# Patient Record
Sex: Male | Born: 1995 | Race: Black or African American | Hispanic: No | Marital: Single | Smoking: Current every day smoker
Health system: Southern US, Community
[De-identification: ages and names within clinical notes are randomized; demographics above are authoritative.]

---
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
## 2015-03-28 ENCOUNTER — Emergency Department (HOSPITAL_COMMUNITY)
Admission: EM | Admit: 2015-03-28 | Discharge: 2015-03-28 | Disposition: A | Discharge status: Home / Self Care | Payer: Self-pay | Attending: Emergency Medicine | Admitting: Emergency Medicine

## 2015-03-28 ENCOUNTER — Encounter (HOSPITAL_COMMUNITY): Payer: Self-pay | Admitting: Emergency Medicine

## 2015-03-28 DIAGNOSIS — Z8619 Personal history of other infectious and parasitic diseases: Secondary | ICD-10-CM | POA: Insufficient documentation

## 2015-03-28 DIAGNOSIS — F17200 Nicotine dependence, unspecified, uncomplicated: Secondary | ICD-10-CM | POA: Insufficient documentation

## 2015-03-28 DIAGNOSIS — Z202 Contact with and (suspected) exposure to infections with a predominantly sexual mode of transmission: Secondary | ICD-10-CM | POA: Insufficient documentation

## 2015-03-28 MED ORDER — AZITHROMYCIN 250 MG PO TABS
1000.0000 mg | ORAL_TABLET | Freq: Once | ORAL | Status: AC
  Administered 2015-03-28: 1000 mg via ORAL
  Filled 2015-03-28: qty 4

## 2015-03-28 MED ORDER — CEFTRIAXONE SODIUM 250 MG IJ SOLR
250.0000 mg | Freq: Once | INTRAMUSCULAR | Status: AC
  Administered 2015-03-28: 250 mg via INTRAMUSCULAR
  Filled 2015-03-28: qty 250

## 2015-03-28 MED ORDER — LIDOCAINE HCL (PF) 1 % IJ SOLN
2.0000 mL | Freq: Once | INTRAMUSCULAR | Status: AC
  Administered 2015-03-28: 0.9 mL
  Filled 2015-03-28: qty 5

## 2015-03-28 NOTE — ED Notes (Signed)
Bed: WA27 Expected date:  Expected time:  Means of arrival:  Comments: 
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
## 2015-03-28 NOTE — Discharge Instructions (Signed)
1. Medications: usual home medications 2. Treatment: rest, drink plenty of fluids 3. Follow Up: please followup with your primary doctor for discussion of your diagnoses and further evaluation after today's visit; if you do not have a primary care doctor use the resource guide provided to find one; please return to the ER for abdominal pain, vomiting, penile pain or discharge, testicular pain or swelling    Sexually Transmitted Disease A sexually transmitted disease (STD) is a disease or infection that may be passed (transmitted) from person to person, usually during sexual activity. This may happen by way of saliva, semen, blood, vaginal mucus, or urine. Common STDs include:  Gonorrhea.  Chlamydia.  Syphilis.  HIV and AIDS.  Genital herpes.  Hepatitis B and C.  Trichomonas.  Human papillomavirus (HPV).  Pubic lice.  Scabies.  Mites.  Bacterial vaginosis. WHAT ARE CAUSES OF STDs? An STD may be caused by bacteria, a virus, or parasites. STDs are often transmitted during sexual activity if one person is infected. However, they may also be transmitted through nonsexual means. STDs may be transmitted after:   Sexual intercourse with an infected person.  Sharing sex toys with an infected person.  Sharing needles with an infected person or using unclean piercing or tattoo needles.  Having intimate contact with the genitals, mouth, or rectal areas of an infected person.  Exposure to infected fluids during birth. WHAT ARE THE SIGNS AND SYMPTOMS OF STDs? Different STDs have different symptoms. Some people may not have any symptoms. If symptoms are present, they may include:  Painful or bloody urination.  Pain in the pelvis, abdomen, vagina, anus, throat, or eyes.  A skin rash, itching, or irritation.  Growths, ulcerations, blisters, or sores in the genital and anal areas.  Abnormal vaginal discharge with or without bad odor.  Penile discharge in men.  Fever.  Pain  or bleeding during sexual intercourse.  Swollen glands in the groin area.  Yellow skin and eyes (jaundice). This is seen with hepatitis.  Swollen testicles.  Infertility.  Sores and blisters in the mouth. HOW ARE STDs DIAGNOSED? To make a diagnosis, your health care provider may:  Take a medical history.  Perform a physical exam.  Take a sample of any discharge to examine.  Swab the throat, cervix, opening to the penis, rectum, or vagina for testing.  Test a sample of your first morning urine.  Perform blood tests.  Perform a Pap test, if this applies.  Perform a colposcopy.  Perform a laparoscopy. HOW ARE STDs TREATED? Treatment depends on the STD. Some STDs may be treated but not cured.  Chlamydia, gonorrhea, trichomonas, and syphilis can be cured with antibiotic medicine.  Genital herpes, hepatitis, and HIV can be treated, but not cured, with prescribed medicines. The medicines lessen symptoms.  Genital warts from HPV can be treated with medicine or by freezing, burning (electrocautery), or surgery. Warts may come back.  HPV cannot be cured with medicine or surgery. However, abnormal areas may be removed from the cervix, vagina, or vulva.  If your diagnosis is confirmed, your recent sexual partners need treatment. This is true even if they are symptom-free or have a negative culture or evaluation. They should not have sex until their health care providers say it is okay.  Your health care provider may test you for infection again 3 months after treatment. HOW CAN I REDUCE MY RISK OF GETTING AN STD? Take these steps to reduce your risk of getting an STD:  Use latex  condoms, dental dams, and water-soluble lubricants during sexual activity. Do not use petroleum jelly or oils.  Avoid having multiple sex partners.  Do not have sex with someone who has other sex partners  Do not have sex with anyone you do not know or who is at high risk for an STD.  Avoid risky  sex practices that can break your skin.  Do not have sex if you have open sores on your mouth or skin.  Avoid drinking too much alcohol or taking illegal drugs. Alcohol and drugs can affect your judgment and put you in a vulnerable position.  Avoid engaging in oral and anal sex acts.  Get vaccinated for HPV and hepatitis. If you have not received these vaccines in the past, talk to your health care provider about whether one or both might be right for you.  If you are at risk of being infected with HIV, it is recommended that you take a prescription medicine daily to prevent HIV infection. This is called pre-exposure prophylaxis (PrEP). You are considered at risk if:  You are a man who has sex with other men (MSM).  You are a heterosexual man or woman and are sexually active with more than one partner.  You take drugs by injection.  You are sexually active with a partner who has HIV.  Talk with your health care provider about whether you are at high risk of being infected with HIV. If you choose to begin PrEP, you should first be tested for HIV. You should then be tested every 3 months for as long as you are taking PrEP. WHAT SHOULD I DO IF I THINK I HAVE AN STD?  See your health care provider.  Tell your sexual partner(s). They should be tested and treated for any STDs.  Do not have sex until your health care provider says it is okay. WHEN SHOULD I GET IMMEDIATE MEDICAL CARE? Contact your health care provider right away if:   You have severe abdominal pain.  You are a man and notice swelling or pain in your testicles.  You are a woman and notice swelling or pain in your vagina.   This information is not intended to replace advice given to you by your health care provider. Make sure you discuss any questions you have with your health care provider.   Document Released: 07/02/2002 Document Revised: 05/02/2014 Document Reviewed: 10/30/2012 Elsevier Interactive Patient Education  2016 [employer].    Emergency Department Resource Guide 1) Find a Doctor and Pay Out of Pocket Although you won't have to find out who is covered by your insurance plan, it is a good idea to ask around and get recommendations. You will then need to call the office and see if the doctor you have chosen will accept you as a new patient and what types of options they offer for patients who are self-pay. Some doctors offer discounts or will set up payment plans for their patients who do not have insurance, but you will need to ask so you aren't surprised when you get to your appointment.  2) Contact Your Local Health Department Not all health departments have doctors that can see patients for sick visits, but many do, so it is worth a call to see if yours does. If you don't know where your local health department is, you can check in your phone book. The CDC also has a tool to help you locate your state's health department, and many state websites also  have listings of all of their local health departments.  3) Find a Walk-in Clinic If your illness is not likely to be very severe or complicated, you may want to try a walk in clinic. These are popping up all over the country in pharmacies, drugstores, and shopping centers. They're usually staffed by nurse practitioners or physician assistants that have been trained to treat common illnesses and complaints. They're usually fairly quick and inexpensive. However, if you have serious medical issues or chronic medical problems, these are probably not your best option.  No Primary Care Doctor: - Call Health Connect at  [phone] - they can help you locate a primary care doctor that  accepts your insurance, provides certain services, etc. - Physician Referral Service- [phone]  Chronic Pain Problems: Organization         Address  Phone   Notes  [facility]  [phone] Patients need to be referred by their primary care doctor.    Medication Assistance: Organization         Address  Phone   Notes  [facility] Medication [facility] [address] [place] [place], [place] [address] [phone] --Must be a resident of [facility] -- Must have NO insurance coverage whatsoever (no Medicaid/ Medicare, etc.) -- The pt. MUST have a primary care doctor that directs their care regularly and follows them in the community   [facility]ist  [phone]   [employer]  [phone]    Agencies that provide inexpensive medical care: Organization         Address  Phone   Notes  [facility]  [phone]   [facility]    [phone]   [facility] [address] [place], [place] [address] [phone]   [facility] [address] [place] [place] [phone]   Planned Parenthood    [phone]   [facility]    [phone]   [facility] and [facility]  [address], [place] Phone:  [phone], Fax:  [phone] Hours of Operation:  9 am - 6 pm, M-F.  Also accepts Medicaid/Medicare and self-pay.  [facility] for Children  [address], [place] Phone: [phone], Fax: [phone]. Hours of Operation:  8:30 am - 5:30 pm, M-F.  Also accepts Medicaid and self-pay.  [facility] [place] [address] [place] Phone: [phone]   [facility] [address] Natasha Bence [place], [place] [phone], Ext. [phone] Mondays & Thursdays: 7-9 AM.  First 15 patients are seen on a first come, first serve basis.    Medicaid-accepting [facility] Providers:  Organization         Address  Phone   Notes  [facility] [address],  [phone] Also accepts self-pay patients.  [facility] [address] Laurell Josephs [place], [place]  [phone]   [facility] [address], [place] [phone]   [facility] [address] [place] [phone]   Renaye Rakers [address], [place]   [phone] Only accepts [place] Access [place] patients after they have their name applied to their card.   Self-Pay (no insurance) in [facility]:  [employer]  Notes  Sickle Cell Patients, [facility] [address] [place], [place] [phone]   [facility] [address] [place], [place] [phone]   [facility] [place]  [address], [place] [phone]   [facility]/Dr. Osei-Bonsu  [address], [place] or [address], [place] [phone] Phone number for both [place] and [place] locations is the same.  [facility] [address], [place] [phone]   [facility] [address] [place] or [address] [phone]   [facility] [address] [place] [phone], phone; [phone], fax Sees patients 1st and 3rd Saturday of every month.  Must not qualify for public or private insurance (i.e. Medicaid, Medicare, [place] Health Choice, Veterans' Benefits)  Household income should be no more than 200% of the poverty level The clinic cannot treat you if you are pregnant or think you are pregnant  Sexually transmitted diseases are not treated at the clinic.    Dental Care: Organization         Address  Phone  Notes  [facility] Department of [facility] [address] [place], [place] [phone] Accepts children up to age 29 who are enrolled in [place] or [place] Health Choice; pregnant women with a Medicaid card; and children who have applied for Medicaid or [place] Health Choice, but were declined, whose parents can pay a reduced fee at time of service.    [facility] Department of [facility]  [address], [place] [phone] Accepts children up to age 59 who are enrolled in [place] or [place] Health Choice; pregnant women with a Medicaid card; and children who have applied for Medicaid or  Health Choice, but were declined, whose parents can pay a reduced fee at time of service.  [facility]  [address] [place], [place] [phone] Patients are seen by appointment only. Walk-ins are not accepted. [facility] will see patients 63 years of age and older. Monday - Tuesday (8am-5pm) Most Wednesdays (8:30-5pm) $30 per visit, cash only  [facility] Adult Dental Access PROGRAM  [address], [facility] [phone] Patients are seen by appointment only. Walk-ins are not accepted. [facility] will see patients 13 years of age and older. One Wednesday Evening (Monthly: Volunteer Based).  $30 per visit, cash only  [employer]  [phone] for adults; Children under age 76, call Graduate Pediatric Dentistry at [phone]. Children aged 44-14, please call [phone] to request a pediatric application.  Dental services are provided in all areas of dental care including fillings, crowns and bridges, complete and partial dentures, implants, gum treatment, root canals, and extractions. Preventive care is also provided. Treatment is provided to both adults and children. Patients are selected via a lottery and there is often a waiting list.   [facility] [address] [place]  [phone] [email or web address]   [facility] [address] [place], [place] [phone], Ext. [phone] Second and Fourth Thursday of each month, opens at 6:30 AM; Clinic ends at 9 AM.  Patients are seen on a first-come first-served basis, and a limited number are seen during each clinic.   [facility]  [address] Ether Griffins [place], [place] [phone]  Eligibility Requirements You must have lived in [place], [place], or [place] counties for at least the last three months.   You cannot be eligible for state or federal sponsored [place], including [employer], [place], or [employer].   You generally cannot be eligible for healthcare insurance through your employer.    How to apply: Eligibility screenings are held every Tuesday and Wednesday afternoon from 1:00 pm until 4:00 pm. You do not need an appointment for the interview!  [facility] [address] [place], [place] [phone]   [facility] Health Department  [phone]   [facility] Department  [phone]   [facility] Health Department  [phone]    Behavioral Health Resources in the Community: Intensive Outpatient Programs Organization         Address  Phone  Notes  [facility] [address], [place], [place] [phone]   [facility] Outpatient [address], [place], [place] [phone]   [facility] [address] [place], [place]  [phone]   [facility] Mental Health [address]  [place], [place] [phone] or [phone]   Substance Abuse Resources Organization         Address  Phone  Notes  [facility]  [phone]   [facility]  [phone]   [facility] [place]  [phone]   Floydene Flock  [phone]   Residential & Outpatient Substance Abuse Program  [phone]   Psychological Services Organization         Address  Phone  Notes  [facility] Behavioral Health  [phone]   [facility] Services  [phone]   [facility] Mental Health [address] [place] [phone] or [phone]    Mobile Crisis Teams Organization         Address  Phone  Notes  [facility], Mobile Crisis Care Unit  [phone]   [facility]  [address] [place], [place] [phone]   Doristine Locks [address] [place] [phone]    Self-Help/Support Groups Organization         Address  Phone             Notes  [facility] [place] - variety of support groups  336- I7437963 Call for more information  Narcotics Anonymous (NA), [facility] [address], [employer] [place]  2 meetings at this location   Statistician         Address  Phone  Notes  [facility] [address],    [place]  [phone]   [facility]  [address] [place] [address], [place], [place] [phone]   [facility] [address] [place], [place] [phone] Admissions: 8am-3pm M-F  [facility] [address]    [place], [place] [phone]   [facility] [address] Starling Manns [place], [place] [phone]   [facility] [address]  [place], [place] [phone]   [facility] - Intensive Outpatient [address] Laurell Josephs [address], [place], [place] [phone]   [facility] ([facility] [address] [place].,  [place], [place] [phone] or [phone]   [facility] ([facility]) [address] [place], [place] [phone] Accepts Medicaid  [facility] [place] [address],  [place] [phone] Substance Abuse/Addiction Treatment   [facility] Resources Organization  Address  Phone  Notes  [facility]  [phone]   Domenic Schwab, PhD [address] Arlis [place], [place]   [phone] or [phone]   [place], [place] [phone]   [address] [place], [place] [phone] Insurance/Medicaid/sponsorship through [facility] and Families [address]                                    [place], [place] [phone]  [place] [address], [place] [phone]    Dr. Adele Schilder  [phone]   [facility] Dept. 1) [address] [place] 2) [place] 3)  [address], [place] [phone]  [phone]   [facility] [phone] or [phone] (After Hours)

## 2015-03-28 NOTE — ED Notes (Signed)
Pt had no reaction to IM ABT at this time.
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
## 2015-03-28 NOTE — ED Notes (Signed)
Awake. Verbally responsive. A/O x4. Resp even and unlabored. No audible adventitious breath sounds noted. ABC's intact.  

## 2015-03-30 LAB — GC/CHLAMYDIA PROBE AMP (~~LOC~~) NOT AT ARMC
CHLAMYDIA, DNA PROBE: NEGATIVE
NEISSERIA GONORRHEA: NEGATIVE

## 2016-04-16 DIAGNOSIS — W25XXXA Contact with sharp glass, initial encounter: Secondary | ICD-10-CM | POA: Insufficient documentation

## 2016-04-16 DIAGNOSIS — Y929 Unspecified place or not applicable: Secondary | ICD-10-CM | POA: Insufficient documentation

## 2016-04-16 DIAGNOSIS — S21211A Laceration without foreign body of right back wall of thorax without penetration into thoracic cavity, initial encounter: Secondary | ICD-10-CM | POA: Insufficient documentation

## 2016-04-16 DIAGNOSIS — Y999 Unspecified external cause status: Secondary | ICD-10-CM | POA: Insufficient documentation

## 2016-04-16 DIAGNOSIS — F17200 Nicotine dependence, unspecified, uncomplicated: Secondary | ICD-10-CM | POA: Insufficient documentation

## 2016-04-16 DIAGNOSIS — Y939 Activity, unspecified: Secondary | ICD-10-CM | POA: Insufficient documentation

## 2016-04-16 DIAGNOSIS — Z23 Encounter for immunization: Secondary | ICD-10-CM | POA: Insufficient documentation

## 2016-04-17 ENCOUNTER — Emergency Department (HOSPITAL_COMMUNITY)
Admission: EM | Admit: 2016-04-17 | Discharge: 2016-04-17 | Disposition: A | Discharge status: Home / Self Care | Payer: Self-pay | Attending: Emergency Medicine | Admitting: Emergency Medicine

## 2016-04-17 ENCOUNTER — Encounter (HOSPITAL_COMMUNITY): Payer: Self-pay | Admitting: *Deleted

## 2016-04-17 DIAGNOSIS — S21211A Laceration without foreign body of right back wall of thorax without penetration into thoracic cavity, initial encounter: Secondary | ICD-10-CM

## 2016-04-17 MED ORDER — BACITRACIN ZINC 500 UNIT/GM EX OINT
1.0000 "application " | TOPICAL_OINTMENT | Freq: Two times a day (BID) | CUTANEOUS | Status: DC
  Administered 2016-04-17: 1 via TOPICAL
  Filled 2016-04-17: qty 0.9

## 2016-04-17 MED ORDER — TETANUS-DIPHTH-ACELL PERTUSSIS 5-2.5-18.5 LF-MCG/0.5 IM SUSP
0.5000 mL | Freq: Once | INTRAMUSCULAR | Status: AC
  Administered 2016-04-17: 0.5 mL via INTRAMUSCULAR
  Filled 2016-04-17: qty 0.5

## 2016-04-17 NOTE — ED Notes (Signed)
Pt verbalized understanding discharge instructions and denies any further needs or questions at this time. VS stable, ambulatory and steady gait.   
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
## 2017-10-30 ENCOUNTER — Ambulatory Visit (HOSPITAL_COMMUNITY)
Admission: EM | Admit: 2017-10-30 | Discharge: 2017-10-30 | Disposition: A | Discharge status: Home / Self Care | Payer: Self-pay | Attending: Family Medicine | Admitting: Family Medicine

## 2017-10-30 ENCOUNTER — Encounter (HOSPITAL_COMMUNITY): Payer: Self-pay | Admitting: Emergency Medicine

## 2017-10-30 DIAGNOSIS — S39012A Strain of muscle, fascia and tendon of lower back, initial encounter: Secondary | ICD-10-CM

## 2017-10-30 MED ORDER — DICLOFENAC SODIUM 75 MG PO TBEC: 75.0000 mg | DELAYED_RELEASE_TABLET | Freq: Two times a day (BID) | ORAL | 0 refills | Status: AC

## 2017-10-30 MED ORDER — CYCLOBENZAPRINE HCL 10 MG PO TABS: 10.0000 mg | ORAL_TABLET | Freq: Three times a day (TID) | ORAL | 0 refills | Status: AC
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
## 2019-07-22 ENCOUNTER — Other Ambulatory Visit: Payer: Self-pay

## 2019-07-22 ENCOUNTER — Emergency Department (HOSPITAL_COMMUNITY)
Admission: EM | Admit: 2019-07-22 | Discharge: 2019-07-23 | Disposition: A | Discharge status: Home / Self Care | Payer: Self-pay | Attending: Emergency Medicine | Admitting: Emergency Medicine

## 2019-07-22 ENCOUNTER — Encounter (HOSPITAL_COMMUNITY): Payer: Self-pay

## 2019-07-22 DIAGNOSIS — K920 Hematemesis: Secondary | ICD-10-CM | POA: Insufficient documentation

## 2019-07-22 DIAGNOSIS — Z20822 Contact with and (suspected) exposure to covid-19: Secondary | ICD-10-CM | POA: Insufficient documentation

## 2019-07-22 DIAGNOSIS — E860 Dehydration: Secondary | ICD-10-CM | POA: Insufficient documentation

## 2019-07-22 DIAGNOSIS — F17210 Nicotine dependence, cigarettes, uncomplicated: Secondary | ICD-10-CM | POA: Insufficient documentation

## 2019-07-22 DIAGNOSIS — K226 Gastro-esophageal laceration-hemorrhage syndrome: Secondary | ICD-10-CM | POA: Insufficient documentation

## 2019-07-22 DIAGNOSIS — R112 Nausea with vomiting, unspecified: Secondary | ICD-10-CM

## 2019-07-22 LAB — CBC
HCT: 50.5 % (ref 39.0–52.0)
Hemoglobin: 18 g/dL — ABNORMAL HIGH (ref 13.0–17.0)
MCH: 33.6 pg (ref 26.0–34.0)
MCHC: 35.6 g/dL (ref 30.0–36.0)
MCV: 94.2 fL (ref 80.0–100.0)
Platelets: 227 10*3/uL (ref 150–400)
RBC: 5.36 MIL/uL (ref 4.22–5.81)
RDW: 11.9 % (ref 11.5–15.5)
WBC: 11.3 10*3/uL — ABNORMAL HIGH (ref 4.0–10.5)
nRBC: 0 % (ref 0.0–0.2)

## 2019-07-22 LAB — URINALYSIS, ROUTINE W REFLEX MICROSCOPIC
Bilirubin Urine: NEGATIVE
Glucose, UA: NEGATIVE mg/dL
Hgb urine dipstick: NEGATIVE
Ketones, ur: 20 mg/dL — AB
Leukocytes,Ua: NEGATIVE
Nitrite: NEGATIVE
Protein, ur: >=300 mg/dL — AB
Specific Gravity, Urine: 1.035 — ABNORMAL HIGH (ref 1.005–1.030)
pH: 5 (ref 5.0–8.0)

## 2019-07-22 LAB — BASIC METABOLIC PANEL
Anion gap: 18 — ABNORMAL HIGH (ref 5–15)
BUN: 19 mg/dL (ref 6–20)
CO2: 23 mmol/L (ref 22–32)
Calcium: 10.4 mg/dL — ABNORMAL HIGH (ref 8.9–10.3)
Chloride: 100 mmol/L (ref 98–111)
Creatinine, Ser: 1.3 mg/dL — ABNORMAL HIGH (ref 0.61–1.24)
GFR calc Af Amer: >60 mL/min (ref >60)
GFR calc non Af Amer: >60 mL/min (ref >60)
Glucose, Bld: 124 mg/dL — ABNORMAL HIGH (ref 70–99)
Potassium: 4.6 mmol/L (ref 3.5–5.1)
Sodium: 141 mmol/L (ref 135–145)

## 2019-07-22 MED ORDER — LACTATED RINGERS IV BOLUS
1000.0000 mL | Freq: Once | INTRAVENOUS | Status: AC
  Administered 2019-07-22: 1000 mL via INTRAVENOUS

## 2019-07-22 MED ORDER — FENTANYL CITRATE (PF) 100 MCG/2ML IJ SOLN
50.0000 ug | Freq: Once | INTRAMUSCULAR | Status: AC
  Administered 2019-07-22: 50 ug via INTRAVENOUS
  Filled 2019-07-22: qty 2

## 2019-07-22 MED ORDER — ONDANSETRON HCL 4 MG/2ML IJ SOLN
4.0000 mg | Freq: Once | INTRAMUSCULAR | Status: AC
  Administered 2019-07-22: 4 mg via INTRAVENOUS
  Filled 2019-07-22: qty 2

## 2019-07-22 NOTE — ED Triage Notes (Signed)
Arrived POV from home patient reports vomiting since 8 AM this morning and bilateral flank/ lower back pain.

## 2019-07-22 NOTE — ED Provider Notes (Addendum)
[facility] DEPT Provider Note   CSN: [id number] Arrival date & time: 07/22/19  1850     History Chief Complaint  Patient presents with  . Flank Pain  . Emesis    Brad Harris is a 24 y.o. male.  HPI    24 year old male comes in a chief complaint of abdominal pain.  Patient reports that he woke up this morning and started noticing abdominal pain.  He has generalized abdominal pain.  He has associated nausea, vomiting and 2 episodes of loose bowel movements.  Patient has had multiple episodes of emesis and eventually he had an emesis that was blood-tinged therefore he decided to come to the ER.  He also has had bilious emesis.  Patient was having some dizziness also.  He admits to daily alcohol consumption, typically 64 ounce of beer.  History reviewed. No pertinent past medical history.  There are no problems to display for this patient.   History reviewed. No pertinent surgical history.     History reviewed. No pertinent family history.  Social History   Tobacco Use  . Smoking status: Current Every Day Smoker    Types: Cigarettes  . Smokeless tobacco: Never Used  Substance Use Topics  . Alcohol use: Yes  . Drug use: No    Home Medications Prior to Admission medications   Medication Sig Start Date End Date Taking? Authorizing Provider  cyclobenzaprine (FLEXERIL) 10 MG tablet Take 1 tablet (10 mg total) by mouth 3 (three) times daily. 10/30/17   Vanessa Kick, MD  diclofenac (VOLTAREN) 75 MG EC tablet Take 1 tablet (75 mg total) by mouth 2 (two) times daily. 10/30/17   Vanessa Kick, MD    Allergies    Patient has no known allergies.  Review of Systems   Review of Systems  Constitutional: Positive for activity change.  Respiratory: Negative for shortness of breath.   Cardiovascular: Negative for chest pain.  Gastrointestinal: Positive for abdominal pain, nausea and vomiting.  Allergic/Immunologic: Negative for immunocompromised  state.  Hematological: Does not bruise/bleed easily.  All other systems reviewed and are negative.   Physical Exam Updated Vital Signs BP (!) 145/84   Pulse 92   Temp 98.1 F (36.7 C) (Oral)   Resp 15   SpO2 100%   Physical Exam Vitals and nursing note reviewed.  Constitutional:      Appearance: He is well-developed.  HENT:     Head: Atraumatic.  Cardiovascular:     Rate and Rhythm: Normal rate.  Pulmonary:     Effort: Pulmonary effort is normal.  Abdominal:     Palpations: Abdomen is soft.     Tenderness: There is abdominal tenderness. There is no guarding or rebound.  Musculoskeletal:     Cervical back: Neck supple.  Skin:    General: Skin is warm.     Capillary Refill: Capillary refill takes 2 to 3 seconds.  Neurological:     Mental Status: He is alert and oriented to person, place, and time.     ED Results / Procedures / Treatments   Labs (all labs ordered are listed, but only abnormal results are displayed) Labs Reviewed  URINALYSIS, ROUTINE W REFLEX MICROSCOPIC - Abnormal; Notable for the following components:      Result Value   Color, Urine AMBER (*)    APPearance HAZY (*)    Specific Gravity, Urine 1.035 (*)    Ketones, ur 20 (*)    Protein, ur >=300 (*)    Bacteria,  UA MANY (*)    All other components within normal limits  CBC - Abnormal; Notable for the following components:   WBC 11.3 (*)    Hemoglobin 18.0 (*)    All other components within normal limits  BASIC METABOLIC PANEL - Abnormal; Notable for the following components:   Glucose, Bld 124 (*)    Creatinine, Ser 1.30 (*)    Calcium 10.4 (*)    Anion gap 18 (*)    All other components within normal limits  URINE CULTURE  RESPIRATORY PANEL BY RT PCR (FLU A&B, COVID)    EKG None  Radiology No results found.  Procedures Procedures (including critical care time)  Medications Ordered in ED Medications  lactated ringers bolus 1,000 mL (1,000 mLs Intravenous New Bag/Given 07/22/19  2217)  ondansetron (ZOFRAN) injection 4 mg (4 mg Intravenous Given 07/22/19 2220)  fentaNYL (SUBLIMAZE) injection 50 mcg (50 mcg Intravenous Given 07/22/19 2220)    ED Course  I have reviewed the triage vital signs and the nursing notes.  Pertinent labs & imaging results that were available during my care of the patient were reviewed by me and considered in my medical decision making (see chart for details).  Clinical Course as of Jul 23 14  Tue Jul 23, 2019  0016 Pt reassessed. Pt's VSS and WNL. Pt's cap refill < 3 seconds. Pt has been hydrated in the ER and now passed po challenge. We will discharge with antiemetic. Strict ER return precautions have been discussed and pt will return if he is unable to tolerate fluids and symptoms are getting worse.    [AN]    Clinical Course User Index [AN] Derwood Kaplan, MD   MDM Rules/Calculators/A&P                      24 year old comes in a chief complaint of generalized abdominal pain.  Patient is having nausea, vomiting and 2 or 3 episodes of loose BM.  His emesis has been pretty pronounced and he started having some blood-tinged vomit, which prompted him to come to the ER.  DDx includes: Pancreatitis Hepatobiliary pathology including cholecystitis Gastritis/PUD SBO ACS syndrome Mallory-Weiss tear Esophageal varices   It does not seem clinically that patient has liver cirrhosis.  We will get LFTs.  BMP reveals elevated creatinine.  Patient also has high specific gravity in the urine, ketonuria and hemoglobin is hemoconcentrated at 18.  We will hydrate and reassess. Abdominal exam overall was reassuring.  Could be underlying gastritis/gastroenteritis is causing the symptoms.   Reassessment: Patient informs that his daughter had some GI symptoms prior to him getting it. Could be communicable viral syndrome, including covid- will test.   Tetsuo Coppola was evaluated in Emergency Department on 07/23/2019 for the symptoms described  in the history of present illness. He was evaluated in the context of the global COVID-19 pandemic, which necessitated consideration that the patient might be at risk for infection with the SARS-CoV-2 virus that causes COVID-19. Institutional protocols and algorithms that pertain to the evaluation of patients at risk for COVID-19 are in a state of rapid change based on information released by regulatory bodies including the CDC and federal and state organizations. These policies and algorithms were followed during the patient's care in the ED.   Final Clinical Impression(s) / ED Diagnoses Final diagnoses:  Dehydration  Non-intractable vomiting with nausea, unspecified vomiting type  Mallory-Weiss tear  Suspected COVID-19 virus infection    Rx / DC Orders ED Discharge  Orders    None            Derwood Kaplan, MD 07/23/19 [id number]

## 2019-07-22 NOTE — ED Notes (Signed)
Pt have been given fluids, Cracker and Soup.

## 2019-07-23 LAB — URINE CULTURE: Culture: <10000 — AB

## 2019-07-23 LAB — RESPIRATORY PANEL BY RT PCR (FLU A&B, COVID)
Influenza A by PCR: NEGATIVE
Influenza B by PCR: NEGATIVE
SARS Coronavirus 2 by RT PCR: NEGATIVE

## 2019-07-23 MED ORDER — ONDANSETRON 8 MG PO TBDP: 8.0000 mg | ORAL_TABLET | Freq: Three times a day (TID) | ORAL | 0 refills | Status: AC | PRN

## 2019-07-23 NOTE — Discharge Instructions (Addendum)
We saw in the ER for nausea and vomiting. Our labs indicate that you are dehydrated.  Fortunately, your symptoms have improved and gotten better during her stay here.  Please take the medications prescribed.  Recommend clear liquid diet for now.  Please return to the ER if your symptoms worsen; you have increased pain, fevers, chills, inability to keep any medications down, confusion. Otherwise see the outpatient doctor as requested.
